# Patient Record
Sex: Male | Born: 1960 | Hispanic: Yes | Marital: Married | Smoking: Never smoker
Health system: Southern US, Community
[De-identification: ages and names within clinical notes are randomized; demographics above are authoritative.]

## PROBLEM LIST (undated history)

## (undated) DIAGNOSIS — F1020 Alcohol dependence, uncomplicated: Secondary | ICD-10-CM

## (undated) DIAGNOSIS — I10 Essential (primary) hypertension: Secondary | ICD-10-CM

---

## 2019-05-15 DIAGNOSIS — F10929 Alcohol use, unspecified with intoxication, unspecified: Secondary | ICD-10-CM | POA: Insufficient documentation

## 2019-05-15 DIAGNOSIS — Z5321 Procedure and treatment not carried out due to patient leaving prior to being seen by health care provider: Secondary | ICD-10-CM | POA: Insufficient documentation

## 2019-07-13 DIAGNOSIS — F10929 Alcohol use, unspecified with intoxication, unspecified: Secondary | ICD-10-CM | POA: Insufficient documentation

## 2019-07-13 DIAGNOSIS — Y908 Blood alcohol level of 240 mg/100 ml or more: Secondary | ICD-10-CM | POA: Insufficient documentation

## 2019-07-13 DIAGNOSIS — F10920 Alcohol use, unspecified with intoxication, uncomplicated: Secondary | ICD-10-CM

## 2019-08-02 DIAGNOSIS — W0110XA Fall on same level from slipping, tripping and stumbling with subsequent striking against unspecified object, initial encounter: Secondary | ICD-10-CM | POA: Insufficient documentation

## 2019-08-02 DIAGNOSIS — F10920 Alcohol use, unspecified with intoxication, uncomplicated: Secondary | ICD-10-CM

## 2019-08-02 DIAGNOSIS — S0101XA Laceration without foreign body of scalp, initial encounter: Secondary | ICD-10-CM

## 2019-08-02 DIAGNOSIS — Y999 Unspecified external cause status: Secondary | ICD-10-CM | POA: Insufficient documentation

## 2019-08-02 DIAGNOSIS — Y929 Unspecified place or not applicable: Secondary | ICD-10-CM | POA: Insufficient documentation

## 2019-08-02 DIAGNOSIS — W19XXXA Unspecified fall, initial encounter: Secondary | ICD-10-CM

## 2019-08-02 DIAGNOSIS — F10929 Alcohol use, unspecified with intoxication, unspecified: Secondary | ICD-10-CM | POA: Insufficient documentation

## 2019-08-02 DIAGNOSIS — Y939 Activity, unspecified: Secondary | ICD-10-CM | POA: Insufficient documentation

## 2019-08-10 DIAGNOSIS — Z4802 Encounter for removal of sutures: Secondary | ICD-10-CM

## 2019-08-10 DIAGNOSIS — R519 Headache, unspecified: Secondary | ICD-10-CM | POA: Insufficient documentation

## 2019-08-11 DIAGNOSIS — Z5321 Procedure and treatment not carried out due to patient leaving prior to being seen by health care provider: Secondary | ICD-10-CM | POA: Insufficient documentation

## 2019-08-11 DIAGNOSIS — F10929 Alcohol use, unspecified with intoxication, unspecified: Secondary | ICD-10-CM | POA: Insufficient documentation

## 2019-12-22 DIAGNOSIS — F10920 Alcohol use, unspecified with intoxication, uncomplicated: Secondary | ICD-10-CM

## 2019-12-22 DIAGNOSIS — R109 Unspecified abdominal pain: Secondary | ICD-10-CM | POA: Insufficient documentation

## 2019-12-22 DIAGNOSIS — R079 Chest pain, unspecified: Secondary | ICD-10-CM | POA: Insufficient documentation

## 2019-12-22 DIAGNOSIS — Y908 Blood alcohol level of 240 mg/100 ml or more: Secondary | ICD-10-CM | POA: Insufficient documentation

## 2019-12-22 DIAGNOSIS — R519 Headache, unspecified: Secondary | ICD-10-CM | POA: Insufficient documentation

## 2019-12-22 DIAGNOSIS — F10929 Alcohol use, unspecified with intoxication, unspecified: Secondary | ICD-10-CM | POA: Insufficient documentation

## 2019-12-22 DIAGNOSIS — R4182 Altered mental status, unspecified: Secondary | ICD-10-CM | POA: Insufficient documentation

## 2019-12-23 DIAGNOSIS — R519 Headache, unspecified: Secondary | ICD-10-CM | POA: Insufficient documentation

## 2019-12-23 DIAGNOSIS — F10229 Alcohol dependence with intoxication, unspecified: Secondary | ICD-10-CM | POA: Insufficient documentation

## 2019-12-23 DIAGNOSIS — F10920 Alcohol use, unspecified with intoxication, uncomplicated: Secondary | ICD-10-CM

## 2019-12-25 DIAGNOSIS — F10220 Alcohol dependence with intoxication, uncomplicated: Secondary | ICD-10-CM | POA: Insufficient documentation

## 2019-12-25 DIAGNOSIS — F10920 Alcohol use, unspecified with intoxication, uncomplicated: Secondary | ICD-10-CM

## 2020-01-01 DIAGNOSIS — S80211A Abrasion, right knee, initial encounter: Secondary | ICD-10-CM | POA: Insufficient documentation

## 2020-01-01 DIAGNOSIS — R519 Headache, unspecified: Secondary | ICD-10-CM | POA: Insufficient documentation

## 2020-01-01 DIAGNOSIS — Y908 Blood alcohol level of 240 mg/100 ml or more: Secondary | ICD-10-CM | POA: Insufficient documentation

## 2020-01-01 DIAGNOSIS — F10920 Alcohol use, unspecified with intoxication, uncomplicated: Secondary | ICD-10-CM

## 2020-01-01 DIAGNOSIS — Z23 Encounter for immunization: Secondary | ICD-10-CM | POA: Insufficient documentation

## 2020-01-01 DIAGNOSIS — F10220 Alcohol dependence with intoxication, uncomplicated: Secondary | ICD-10-CM | POA: Insufficient documentation

## 2020-01-01 DIAGNOSIS — S80212A Abrasion, left knee, initial encounter: Secondary | ICD-10-CM | POA: Insufficient documentation

## 2020-01-01 DIAGNOSIS — I10 Essential (primary) hypertension: Secondary | ICD-10-CM | POA: Insufficient documentation

## 2020-01-01 HISTORY — DX: Essential (primary) hypertension: I10

## 2020-01-21 DIAGNOSIS — F10920 Alcohol use, unspecified with intoxication, uncomplicated: Secondary | ICD-10-CM | POA: Insufficient documentation

## 2020-01-21 DIAGNOSIS — I10 Essential (primary) hypertension: Secondary | ICD-10-CM | POA: Insufficient documentation

## 2020-01-21 DIAGNOSIS — F1094 Alcohol use, unspecified with alcohol-induced mood disorder: Secondary | ICD-10-CM | POA: Insufficient documentation

## 2020-01-21 DIAGNOSIS — F1994 Other psychoactive substance use, unspecified with psychoactive substance-induced mood disorder: Secondary | ICD-10-CM

## 2020-01-21 DIAGNOSIS — F10929 Alcohol use, unspecified with intoxication, unspecified: Secondary | ICD-10-CM

## 2020-02-02 DIAGNOSIS — Z5321 Procedure and treatment not carried out due to patient leaving prior to being seen by health care provider: Secondary | ICD-10-CM | POA: Insufficient documentation

## 2020-02-02 DIAGNOSIS — Z23 Encounter for immunization: Secondary | ICD-10-CM | POA: Insufficient documentation

## 2020-02-02 DIAGNOSIS — S01419A Laceration without foreign body of unspecified cheek and temporomandibular area, initial encounter: Secondary | ICD-10-CM | POA: Insufficient documentation

## 2020-02-02 DIAGNOSIS — I10 Essential (primary) hypertension: Secondary | ICD-10-CM | POA: Insufficient documentation

## 2020-02-02 DIAGNOSIS — S0181XA Laceration without foreign body of other part of head, initial encounter: Secondary | ICD-10-CM

## 2020-02-12 DIAGNOSIS — W19XXXA Unspecified fall, initial encounter: Secondary | ICD-10-CM | POA: Insufficient documentation

## 2020-02-12 DIAGNOSIS — Y92524 Gas station as the place of occurrence of the external cause: Secondary | ICD-10-CM | POA: Insufficient documentation

## 2020-02-12 DIAGNOSIS — F10920 Alcohol use, unspecified with intoxication, uncomplicated: Secondary | ICD-10-CM

## 2020-02-12 DIAGNOSIS — F10129 Alcohol abuse with intoxication, unspecified: Secondary | ICD-10-CM | POA: Insufficient documentation

## 2020-02-12 DIAGNOSIS — I10 Essential (primary) hypertension: Secondary | ICD-10-CM | POA: Insufficient documentation

## 2020-04-03 DIAGNOSIS — Y92524 Gas station as the place of occurrence of the external cause: Secondary | ICD-10-CM | POA: Insufficient documentation

## 2020-04-03 DIAGNOSIS — I10 Essential (primary) hypertension: Secondary | ICD-10-CM | POA: Insufficient documentation

## 2020-04-03 DIAGNOSIS — T1490XA Injury, unspecified, initial encounter: Secondary | ICD-10-CM

## 2020-04-03 DIAGNOSIS — R4182 Altered mental status, unspecified: Secondary | ICD-10-CM | POA: Insufficient documentation

## 2020-04-03 DIAGNOSIS — S0011XA Contusion of right eyelid and periocular area, initial encounter: Secondary | ICD-10-CM | POA: Insufficient documentation

## 2020-04-03 DIAGNOSIS — X58XXXA Exposure to other specified factors, initial encounter: Secondary | ICD-10-CM | POA: Insufficient documentation

## 2020-04-03 DIAGNOSIS — F10129 Alcohol abuse with intoxication, unspecified: Secondary | ICD-10-CM | POA: Insufficient documentation

## 2020-04-21 DIAGNOSIS — R45851 Suicidal ideations: Secondary | ICD-10-CM

## 2020-04-21 DIAGNOSIS — F1014 Alcohol abuse with alcohol-induced mood disorder: Secondary | ICD-10-CM | POA: Diagnosis present

## 2020-04-21 DIAGNOSIS — Z20822 Contact with and (suspected) exposure to covid-19: Secondary | ICD-10-CM | POA: Insufficient documentation

## 2020-04-21 DIAGNOSIS — F1020 Alcohol dependence, uncomplicated: Secondary | ICD-10-CM | POA: Insufficient documentation

## 2020-04-21 DIAGNOSIS — I10 Essential (primary) hypertension: Secondary | ICD-10-CM | POA: Insufficient documentation

## 2020-04-21 DIAGNOSIS — Z046 Encounter for general psychiatric examination, requested by authority: Secondary | ICD-10-CM | POA: Insufficient documentation

## 2020-04-21 DIAGNOSIS — F4310 Post-traumatic stress disorder, unspecified: Secondary | ICD-10-CM | POA: Insufficient documentation

## 2020-04-21 DIAGNOSIS — F10920 Alcohol use, unspecified with intoxication, uncomplicated: Secondary | ICD-10-CM

## 2020-04-21 DIAGNOSIS — R519 Headache, unspecified: Secondary | ICD-10-CM | POA: Insufficient documentation

## 2020-04-21 DIAGNOSIS — F32A Depression, unspecified: Secondary | ICD-10-CM | POA: Insufficient documentation

## 2020-04-22 DIAGNOSIS — F1014 Alcohol abuse with alcohol-induced mood disorder: Secondary | ICD-10-CM

## 2020-05-13 DIAGNOSIS — Z6829 Body mass index (BMI) 29.0-29.9, adult: Secondary | ICD-10-CM

## 2020-05-13 DIAGNOSIS — F10232 Alcohol dependence with withdrawal with perceptual disturbance: Secondary | ICD-10-CM

## 2020-05-13 DIAGNOSIS — I10 Essential (primary) hypertension: Secondary | ICD-10-CM | POA: Diagnosis present

## 2020-05-13 DIAGNOSIS — Y900 Blood alcohol level of less than 20 mg/100 ml: Secondary | ICD-10-CM | POA: Diagnosis present

## 2020-05-13 DIAGNOSIS — Z20822 Contact with and (suspected) exposure to covid-19: Secondary | ICD-10-CM | POA: Diagnosis present

## 2020-05-13 DIAGNOSIS — F10239 Alcohol dependence with withdrawal, unspecified: Principal | ICD-10-CM | POA: Diagnosis present

## 2020-05-13 DIAGNOSIS — R569 Unspecified convulsions: Secondary | ICD-10-CM | POA: Diagnosis present

## 2020-05-13 DIAGNOSIS — E669 Obesity, unspecified: Secondary | ICD-10-CM | POA: Diagnosis present

## 2020-05-13 DIAGNOSIS — Z79899 Other long term (current) drug therapy: Secondary | ICD-10-CM

## 2020-05-13 DIAGNOSIS — F10939 Alcohol use, unspecified with withdrawal, unspecified: Secondary | ICD-10-CM

## 2020-05-13 DIAGNOSIS — E875 Hyperkalemia: Secondary | ICD-10-CM | POA: Diagnosis present

## 2020-05-13 DIAGNOSIS — Z880 Allergy status to penicillin: Secondary | ICD-10-CM

## 2020-05-13 DIAGNOSIS — E871 Hypo-osmolality and hyponatremia: Secondary | ICD-10-CM | POA: Diagnosis present

## 2020-05-13 DIAGNOSIS — Z8249 Family history of ischemic heart disease and other diseases of the circulatory system: Secondary | ICD-10-CM

## 2020-05-13 DIAGNOSIS — Z886 Allergy status to analgesic agent status: Secondary | ICD-10-CM

## 2020-05-13 DIAGNOSIS — E876 Hypokalemia: Secondary | ICD-10-CM | POA: Diagnosis present

## 2020-05-13 HISTORY — DX: Alcohol dependence, uncomplicated: F10.20

## 2020-05-14 DIAGNOSIS — F10239 Alcohol dependence with withdrawal, unspecified: Principal | ICD-10-CM

## 2020-05-14 NOTE — Progress Notes (Incomplete)
   NAME:  Clifford Williams, MRN:  [id number], DOB:  08-14-1960, LOS: 1 ADMISSION DATE:  05/13/2020, CONSULTATION DATE:  4/3 REFERRING MD:  EDP, CHIEF COMPLAINT:  Etoh w/d sz   History of Present Illness:  60 year old male with history of HTN, EtOH abuse with multiple ER evalulations for intoxication with reported seizure; brought in by EMS 4/3 with subsequent witnessed grand mal seizure ~1 hour post-admission. Received Ativan 2mg  IV. Multiple metabolic abnormalities noted; PCCM service consulted for ICU admission/need for Precedex.  Pertinent Medical History:  Alcoholism  Significant Hospital Events: Including procedures, antibiotic start and stop dates in addition to other pertinent events   . ER eval by psych 04/22/20 rec gabapentin 300 mg tid for prevention and Thiamine but unclear he took it . Witness sz x 2 4/3 with etoh level 11 and last beer about 6-8 prior to sz . CT head 4/3 neg x atrophy; CT neck 4/3 1. No acute osseous abnormality. 2. Marked severity multilevel degenerative changes   Interim History / Subjective:  ***  Objective   Blood pressure 130/87, pulse 61, temperature 98.6 F (37 C), temperature source Oral, resp. rate 14, height 5\' 1"  (1.549 m), weight 71.7 kg, SpO2 99 %.        Intake/Output Summary (Last 24 hours) at 05/14/2020 0744 Last data filed at 05/14/2020 0648 Gross per 24 hour  Intake 1076.17 ml  Output 850 ml  Net 226.17 ml   Filed Weights   05/13/20 2209 05/14/20 0448  Weight: 70.7 kg 71.7 kg   Physical Examination: General: {SRACUITY:25313} ill-appearing *** in NAD. HEENT: [place]/AT, anicteric sclera, PERRL, moist mucous membranes. Neuro: {SRLOC:25308} {SRSTIMULI:25309} {SRCOMMANDS:25310} {SRNEUROEXTREMITIES:25312} Strength ***/5 in *** extremities. {SRRBRAINSTEM:25311}  CV: RRR, no m/g/r. PULM: Breathing even and unlabored on ***. Lung fields ***. GI: Soft, nontender, nondistended. Normoactive bowel sounds. Extremities: *** LE edema noted. Skin: Warm/dry,  ***.   Labs/imaging that I have personally reviewed: (right click and "Reselect all SmartList Selections" daily)  Cmet,  Uds, u/a resp viral panel, salicylate and tylenol levels ct head and cervical  Resolved Hospital Problem List:     Assessment & Plan:  1) Etoh w/d related sz disorder ? Really taking gabapentin as rec by psych but clearly not in status >>> high risk dt's but has not had a chance with CIWA protocol yet so would not use Precedex first like as issue is sz's which would respond better to benzo's anyway  >>> add clonidine to ciwa protocol and one dose IV valium 5 mg IV now to supplement the ciwa protocol    2) Severe Hypomagnesemia > repletion in progress / no evidence of rhabdo clinically nor ecg abn's  >>> check cpks'   Best Practice: (right click and "Reselect all SmartList Selections" daily)  Diet:  NPO Pain/Anxiety/Delirium protocol (if indicated): Yes (RASS goal 0)  CIWA protocol fine VAP protocol (if indicated): Not indicated DVT prophylaxis: SCD GI prophylaxis: PPI Glucose control:  SSI No Central venous access:  N/A Arterial line:  N/A Foley:  N/A Mobility:  bed rest  PT consulted: N/A Last date of multidisciplinary goals of care discussionn/a Code Status:  full code Disposition:  stepdown ok   Critical care time: ***    [facility] 05/14/20 7:45 AM  Please see [email or web address] for pager details.

## 2020-06-24 DIAGNOSIS — F10920 Alcohol use, unspecified with intoxication, uncomplicated: Secondary | ICD-10-CM

## 2020-06-24 DIAGNOSIS — I10 Essential (primary) hypertension: Secondary | ICD-10-CM | POA: Insufficient documentation

## 2020-06-24 DIAGNOSIS — F10129 Alcohol abuse with intoxication, unspecified: Secondary | ICD-10-CM | POA: Insufficient documentation

## 2020-06-24 DIAGNOSIS — Z01818 Encounter for other preprocedural examination: Secondary | ICD-10-CM | POA: Insufficient documentation

## 2020-06-24 DIAGNOSIS — F1010 Alcohol abuse, uncomplicated: Secondary | ICD-10-CM

## 2020-07-25 DIAGNOSIS — F10129 Alcohol abuse with intoxication, unspecified: Secondary | ICD-10-CM | POA: Insufficient documentation

## 2020-07-25 DIAGNOSIS — F10920 Alcohol use, unspecified with intoxication, uncomplicated: Secondary | ICD-10-CM

## 2020-07-25 DIAGNOSIS — R519 Headache, unspecified: Secondary | ICD-10-CM | POA: Insufficient documentation

## 2020-07-25 DIAGNOSIS — I10 Essential (primary) hypertension: Secondary | ICD-10-CM | POA: Insufficient documentation

## 2020-07-25 DIAGNOSIS — R1013 Epigastric pain: Secondary | ICD-10-CM | POA: Insufficient documentation

## 2020-07-25 DIAGNOSIS — Z5321 Procedure and treatment not carried out due to patient leaving prior to being seen by health care provider: Secondary | ICD-10-CM | POA: Insufficient documentation

## 2020-07-25 DIAGNOSIS — R079 Chest pain, unspecified: Secondary | ICD-10-CM | POA: Insufficient documentation

## 2020-07-25 DIAGNOSIS — Y908 Blood alcohol level of 240 mg/100 ml or more: Secondary | ICD-10-CM | POA: Insufficient documentation

## 2020-07-25 DIAGNOSIS — R000 Tachycardia, unspecified: Secondary | ICD-10-CM | POA: Insufficient documentation

## 2020-07-27 DIAGNOSIS — F10929 Alcohol use, unspecified with intoxication, unspecified: Secondary | ICD-10-CM | POA: Insufficient documentation

## 2020-07-27 DIAGNOSIS — Z5321 Procedure and treatment not carried out due to patient leaving prior to being seen by health care provider: Secondary | ICD-10-CM | POA: Insufficient documentation

## 2020-07-28 DIAGNOSIS — R519 Headache, unspecified: Secondary | ICD-10-CM | POA: Insufficient documentation

## 2020-07-28 DIAGNOSIS — F10929 Alcohol use, unspecified with intoxication, unspecified: Secondary | ICD-10-CM | POA: Insufficient documentation

## 2020-07-28 DIAGNOSIS — R4182 Altered mental status, unspecified: Secondary | ICD-10-CM | POA: Insufficient documentation

## 2020-07-28 DIAGNOSIS — R079 Chest pain, unspecified: Secondary | ICD-10-CM | POA: Insufficient documentation

## 2020-07-28 DIAGNOSIS — Z5321 Procedure and treatment not carried out due to patient leaving prior to being seen by health care provider: Secondary | ICD-10-CM | POA: Insufficient documentation

## 2020-09-09 DIAGNOSIS — F10920 Alcohol use, unspecified with intoxication, uncomplicated: Secondary | ICD-10-CM

## 2020-09-09 DIAGNOSIS — F10120 Alcohol abuse with intoxication, uncomplicated: Secondary | ICD-10-CM | POA: Insufficient documentation

## 2020-09-09 DIAGNOSIS — I10 Essential (primary) hypertension: Secondary | ICD-10-CM | POA: Insufficient documentation

## 2020-09-14 DIAGNOSIS — Y92410 Unspecified street and highway as the place of occurrence of the external cause: Secondary | ICD-10-CM | POA: Insufficient documentation

## 2020-09-14 DIAGNOSIS — Y900 Blood alcohol level of less than 20 mg/100 ml: Secondary | ICD-10-CM | POA: Insufficient documentation

## 2020-09-14 DIAGNOSIS — Y9302 Activity, running: Secondary | ICD-10-CM | POA: Insufficient documentation

## 2020-09-14 DIAGNOSIS — S61412A Laceration without foreign body of left hand, initial encounter: Secondary | ICD-10-CM | POA: Insufficient documentation

## 2020-09-14 DIAGNOSIS — S065X0A Traumatic subdural hemorrhage without loss of consciousness, initial encounter: Secondary | ICD-10-CM | POA: Insufficient documentation

## 2020-09-14 DIAGNOSIS — Z20822 Contact with and (suspected) exposure to covid-19: Secondary | ICD-10-CM | POA: Insufficient documentation

## 2020-09-14 DIAGNOSIS — S065X9A Traumatic subdural hemorrhage with loss of consciousness of unspecified duration, initial encounter: Secondary | ICD-10-CM | POA: Diagnosis present

## 2020-09-14 DIAGNOSIS — R918 Other nonspecific abnormal finding of lung field: Secondary | ICD-10-CM | POA: Insufficient documentation

## 2020-09-14 DIAGNOSIS — Z79899 Other long term (current) drug therapy: Secondary | ICD-10-CM | POA: Insufficient documentation

## 2020-09-14 DIAGNOSIS — S3991XA Unspecified injury of abdomen, initial encounter: Secondary | ICD-10-CM | POA: Insufficient documentation

## 2020-09-14 DIAGNOSIS — Z23 Encounter for immunization: Secondary | ICD-10-CM | POA: Insufficient documentation

## 2020-09-14 DIAGNOSIS — S065XAA Traumatic subdural hemorrhage with loss of consciousness status unknown, initial encounter: Secondary | ICD-10-CM | POA: Diagnosis present

## 2020-09-14 DIAGNOSIS — M542 Cervicalgia: Secondary | ICD-10-CM

## 2020-09-14 DIAGNOSIS — T1490XA Injury, unspecified, initial encounter: Secondary | ICD-10-CM

## 2020-09-14 DIAGNOSIS — S50311A Abrasion of right elbow, initial encounter: Secondary | ICD-10-CM | POA: Insufficient documentation

## 2020-09-15 DIAGNOSIS — S065X9A Traumatic subdural hemorrhage with loss of consciousness of unspecified duration, initial encounter: Secondary | ICD-10-CM | POA: Diagnosis present

## 2020-09-15 DIAGNOSIS — S065XAA Traumatic subdural hemorrhage with loss of consciousness status unknown, initial encounter: Secondary | ICD-10-CM | POA: Diagnosis present

## 2020-09-23 DIAGNOSIS — M79642 Pain in left hand: Secondary | ICD-10-CM | POA: Insufficient documentation

## 2020-09-23 DIAGNOSIS — Z5321 Procedure and treatment not carried out due to patient leaving prior to being seen by health care provider: Secondary | ICD-10-CM | POA: Insufficient documentation

## 2020-09-23 DIAGNOSIS — R4182 Altered mental status, unspecified: Secondary | ICD-10-CM | POA: Insufficient documentation

## 2020-09-23 DIAGNOSIS — R0789 Other chest pain: Secondary | ICD-10-CM | POA: Insufficient documentation

## 2022-03-11 IMAGING — CT CT HEAD W/O CM
2 of 3 series · 13 of 47 positions shown, 16 images · non-contrast
Comparison: April 21, 2020

CLINICAL DATA: Seizure-like activity with unknown fall.

EXAM:
CT HEAD WITHOUT CONTRAST
TECHNIQUE: Contiguous axial images were obtained from the base of the skull
through the vertex without intravenous contrast.

[Series 3: head wo · axial · 0.47mm/px · z∈[-150,-15]mm · 10 of 33 slices shown, 13 images]
[im 3/33  brain]
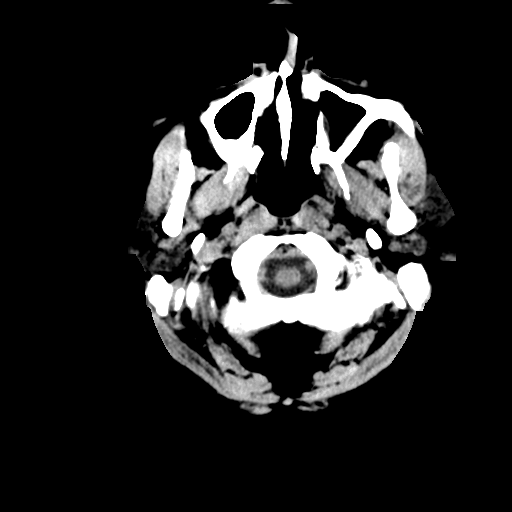
[im 3/33  bone]
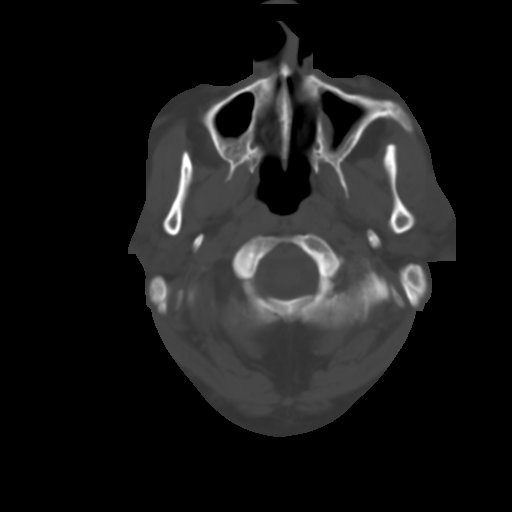
[im 6/33  brain]
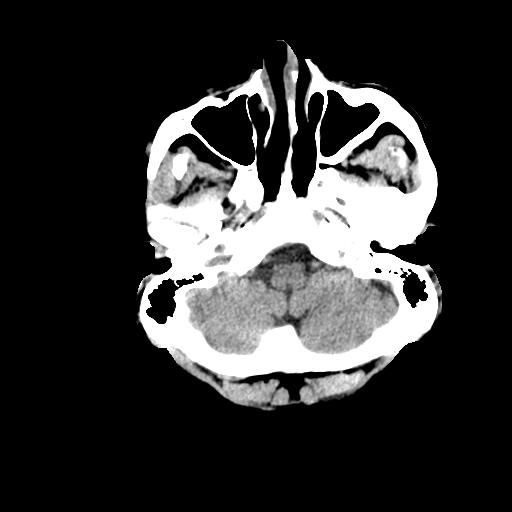
[im 9/33  brain]
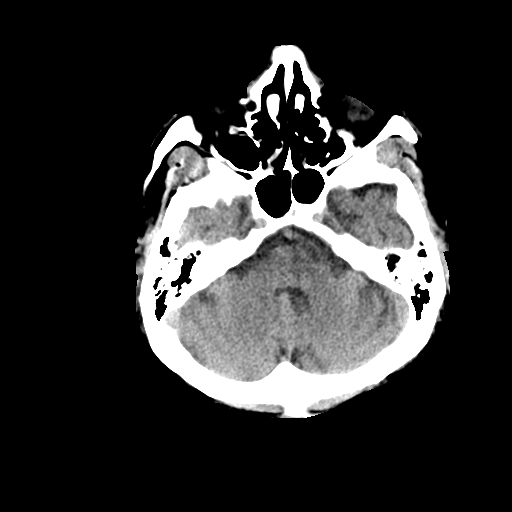
[im 12/33  brain]
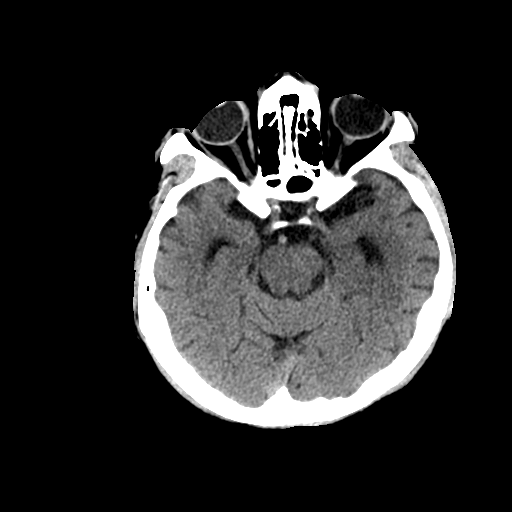
[im 15/33  brain]
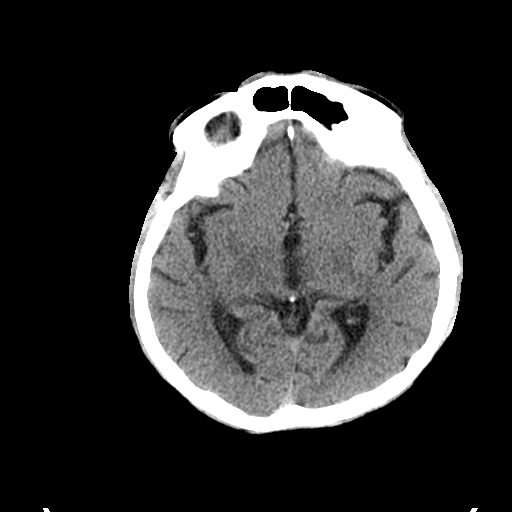
[im 15/33  bone]
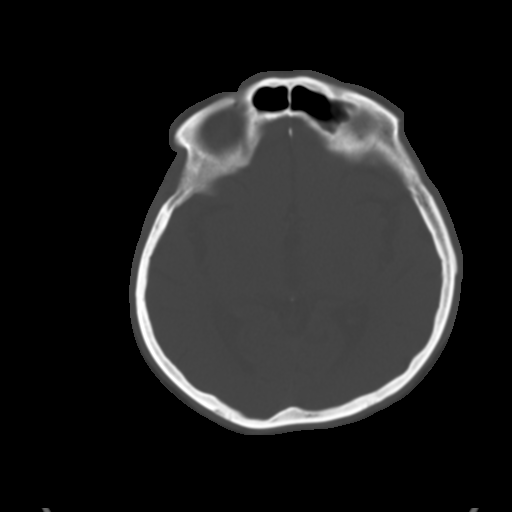
[im 18/33  brain]
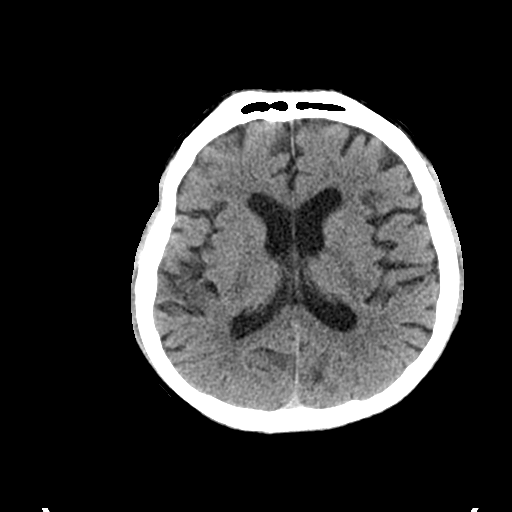
[im 21/33  brain]
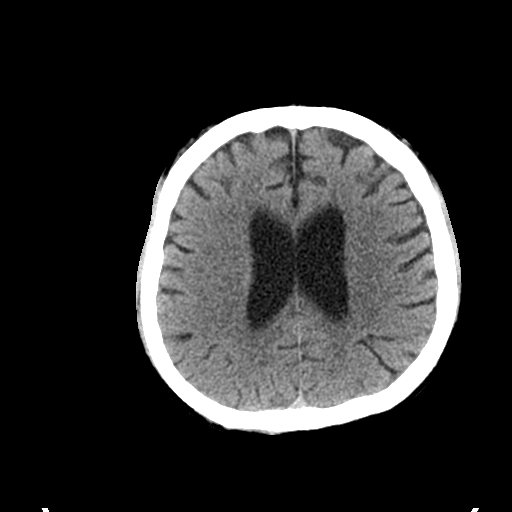
[im 25/33  brain]
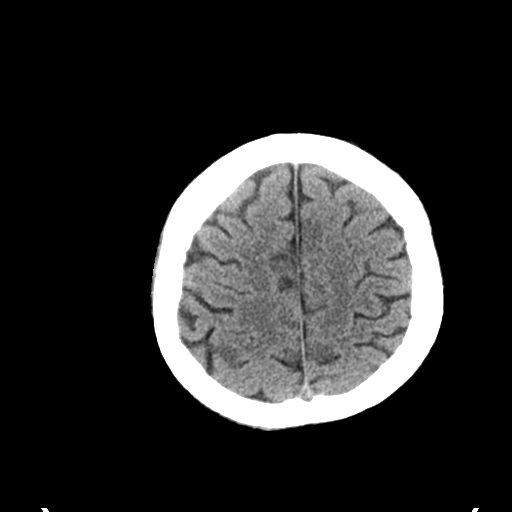
[im 27/33  brain]
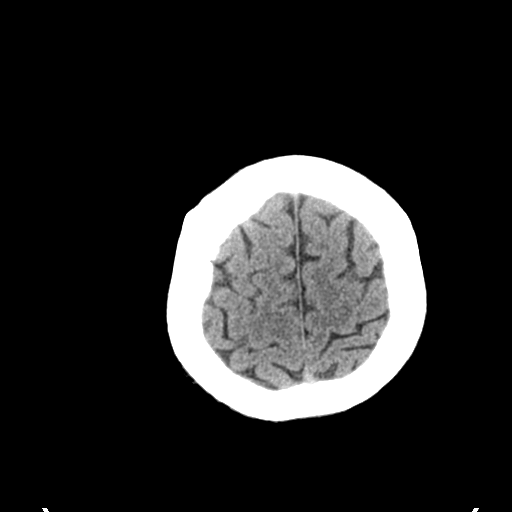
[im 27/33  bone]
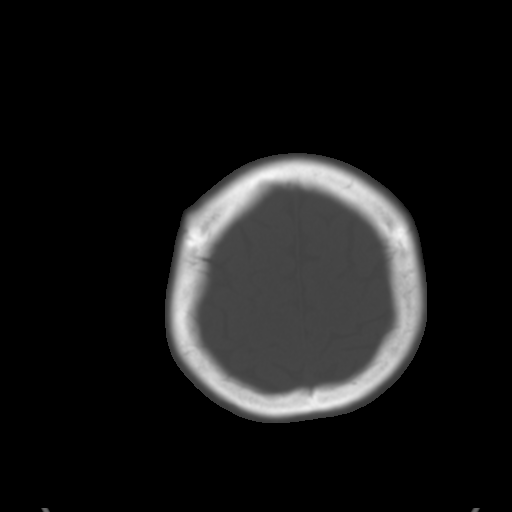
[im 30/33  brain]
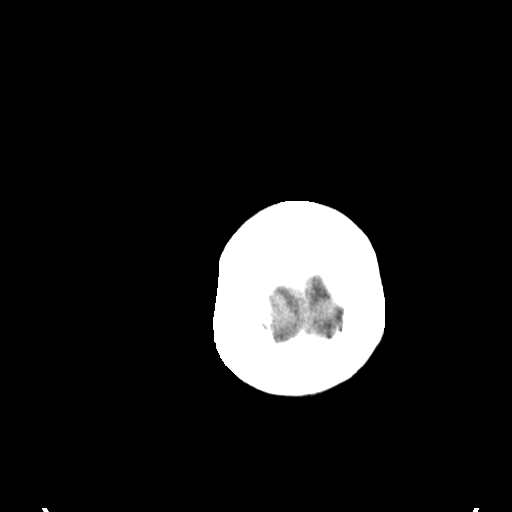

[Series 6: coronal soft tissue · coronal · 0.31mm/px · 3 of 64 slices shown]
[im 22/64  brain]
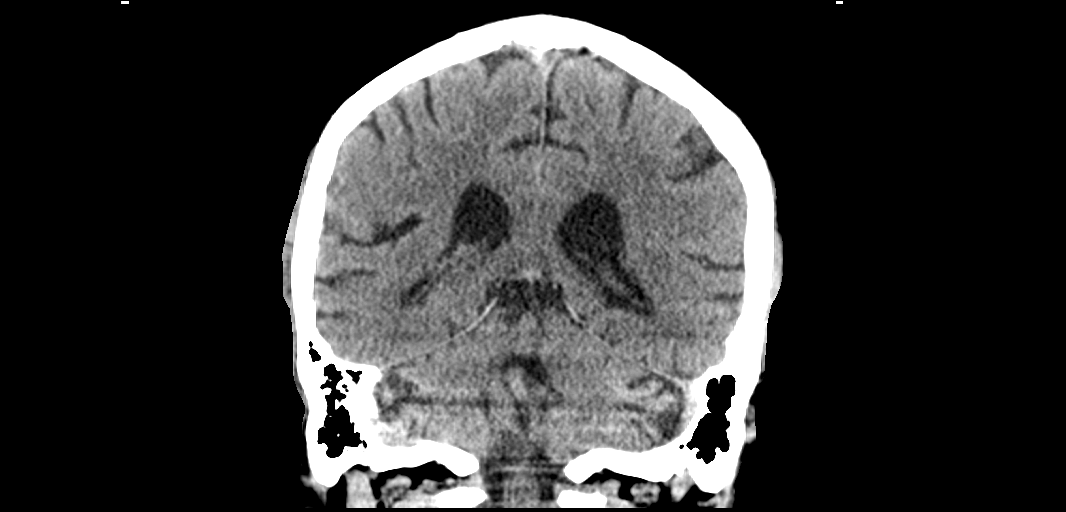
[im 29/64  brain]
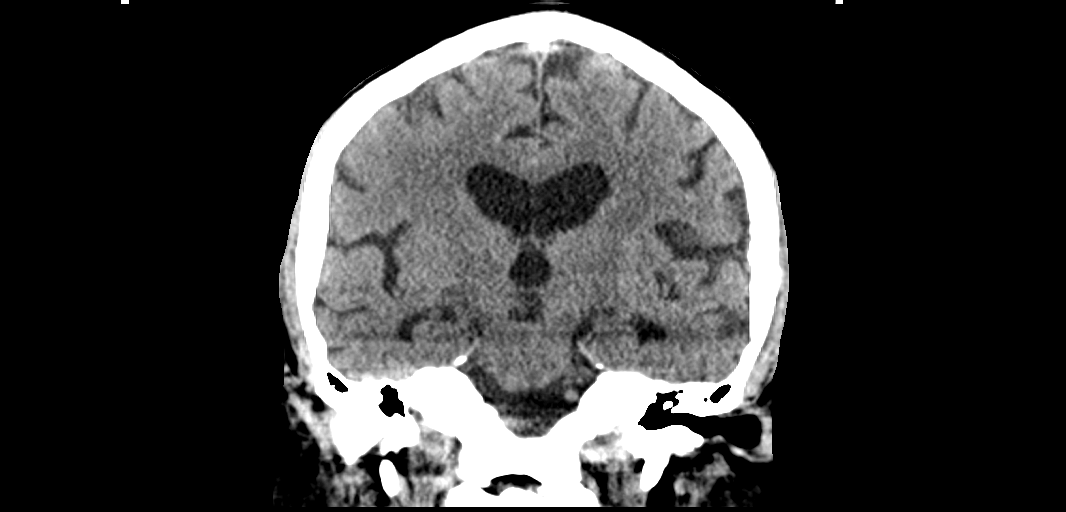
[im 36/64  brain]
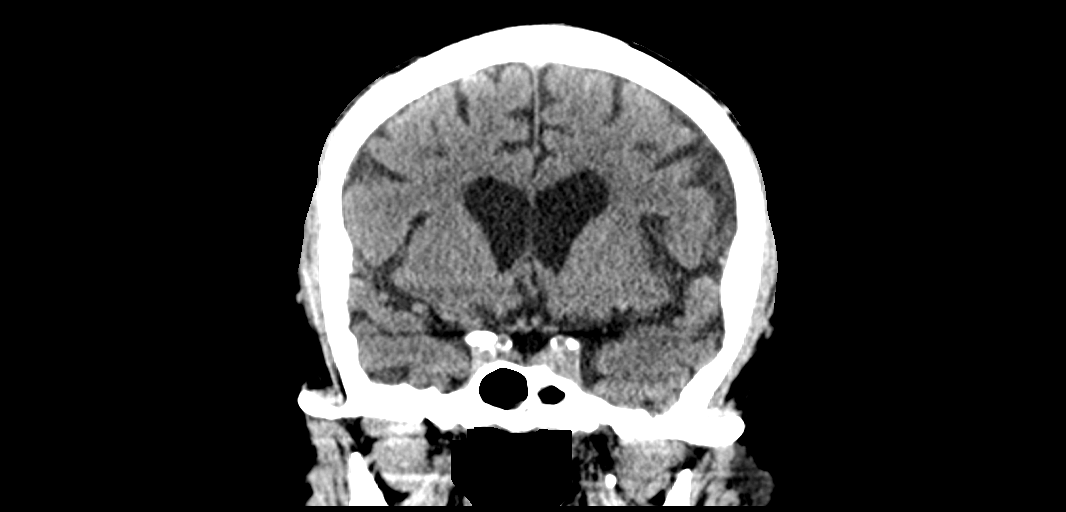

[13 of 47 positions shown; findings below may reference images not displayed]

FINDINGS: Brain: There is mild cerebral atrophy with widening of the
extra-axial spaces and ventricular dilatation.
There are areas of decreased attenuation within the white matter
tracts of the supratentorial brain, consistent with microvascular
disease changes.

A small stable calcification is seen within the right frontoparietal
region.

Vascular: No hyperdense vessel or unexpected calcification.

Skull: Normal. Negative for fracture or focal lesion.

Sinuses/Orbits: No acute finding.

Other: None.
IMPRESSION: 1. Generalized cerebral atrophy.
2. No acute intracranial abnormality.
